# Patient Record
Sex: Male | Born: 2000 | Race: White | Hispanic: No | Marital: Single
Health system: Midwestern US, Academic
[De-identification: ages and names within clinical notes are randomized; demographics above are authoritative.]

---
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
## 2019-01-05 ENCOUNTER — Encounter: Admit: 2019-01-05 | Discharge: 2019-01-05 | Payer: BC Managed Care – PPO

## 2019-01-05 LAB — FREE T4-FREE THYROXINE: Lab: 1 ng/dL (ref 0.6–1.6)

## 2019-01-05 LAB — TSH WITH FREE T4 REFLEX: Lab: 6.7 uU/mL — ABNORMAL HIGH (ref 0.35–5.00)

## 2019-01-05 LAB — THYROGLOBULIN & THYROGLOBULIN AB: Lab: <10 [IU]/mL (ref <116)

## 2019-01-12 ENCOUNTER — Encounter: Admit: 2019-01-12 | Discharge: 2019-01-12 | Payer: BC Managed Care – PPO

## 2019-03-31 ENCOUNTER — Encounter: Admit: 2019-03-31 | Discharge: 2019-03-31 | Payer: BC Managed Care – PPO

## 2019-03-31 NOTE — Telephone Encounter
Patients mother, Delrae Rend, asking for patient to get follow up blood work.

## 2019-03-31 NOTE — Telephone Encounter
He is due for a thyroid test, should have a standing order.  Please also ask him to call us for an appointment around April 2021, tele or in person.  Thank you so much.

## 2019-03-31 NOTE — Telephone Encounter
Mom asked that labs be faxed to Dr Jeneen Rinks Rider's office in [facility].  Fax # is [phone].  She also said she will call to get an appointment for pt.  Faxed Standing Free T4 and TSH using Route order in [place].

## 2019-06-25 ENCOUNTER — Encounter: Admit: 2019-06-25 | Discharge: 2019-06-25 | Payer: BC Managed Care – PPO

## 2019-06-28 ENCOUNTER — Encounter: Admit: 2019-06-28 | Discharge: 2019-06-28 | Payer: BC Managed Care – PPO

## 2019-07-20 NOTE — Telephone Encounter
Informed Stanley Barnes of Dr Charm Rings message.  She said they were back from the doctor.  Patients throat was red and negative for strep.  Gave her the radiology scheduling number.  She said they will get his labs done at that time.

## 2019-07-20 NOTE — Telephone Encounter
Please inform patient   It is good idea to see PCP and we can check his ultrasound since it has been 6 months, and also due for thyroglobulin labs. If can't get these at [facility], can be done locally, I will place an order for them, thanks so much.

## 2019-07-20 NOTE — Telephone Encounter
Mother calling x2 concerning throat pain with patient.  She says she is taking him to his PCP, Dr Nolon Bussing about it but wanted any input from Dr Kirtland Bouchard.Marland Kitchen

## 2019-07-30 MED ORDER — LEVOTHYROXINE 112 MCG PO TAB
224 ug | ORAL_TABLET | Freq: Every day | ORAL | 1 refills | 30.00000 days | Status: DC
Start: 2019-07-30 — End: 2019-07-30

## 2019-07-30 MED ORDER — LEVOTHYROXINE 200 MCG PO TAB
ORAL_TABLET | Freq: Every day | ORAL | 3 refills | 30.00000 days | Status: DC
Start: 2019-07-30 — End: 2019-08-02

## 2019-08-02 ENCOUNTER — Encounter: Admit: 2019-08-02 | Discharge: 2019-08-02 | Payer: BC Managed Care – PPO

## 2019-08-02 DIAGNOSIS — E890 Postprocedural hypothyroidism: Secondary | ICD-10-CM

## 2019-08-02 MED ORDER — LEVOTHYROXINE 200 MCG PO TAB
ORAL_TABLET | Freq: Every day | ORAL | 3 refills | 30.00000 days | Status: DC
Start: 2019-08-02 — End: 2019-09-03

## 2019-09-03 ENCOUNTER — Encounter: Admit: 2019-09-03 | Discharge: 2019-09-03 | Payer: BC Managed Care – PPO

## 2019-09-03 DIAGNOSIS — E890 Postprocedural hypothyroidism: Secondary | ICD-10-CM

## 2019-09-03 MED ORDER — LEVOTHYROXINE 200 MCG PO TAB
ORAL_TABLET | Freq: Every day | ORAL | 3 refills | 30.00000 days | Status: AC
Start: 2019-09-03 — End: ?

## 2019-09-06 IMAGING — CR CHEST
2 series · 2 of 2 positions shown · non-contrast
Comparison: none

[chest pa x-wise]
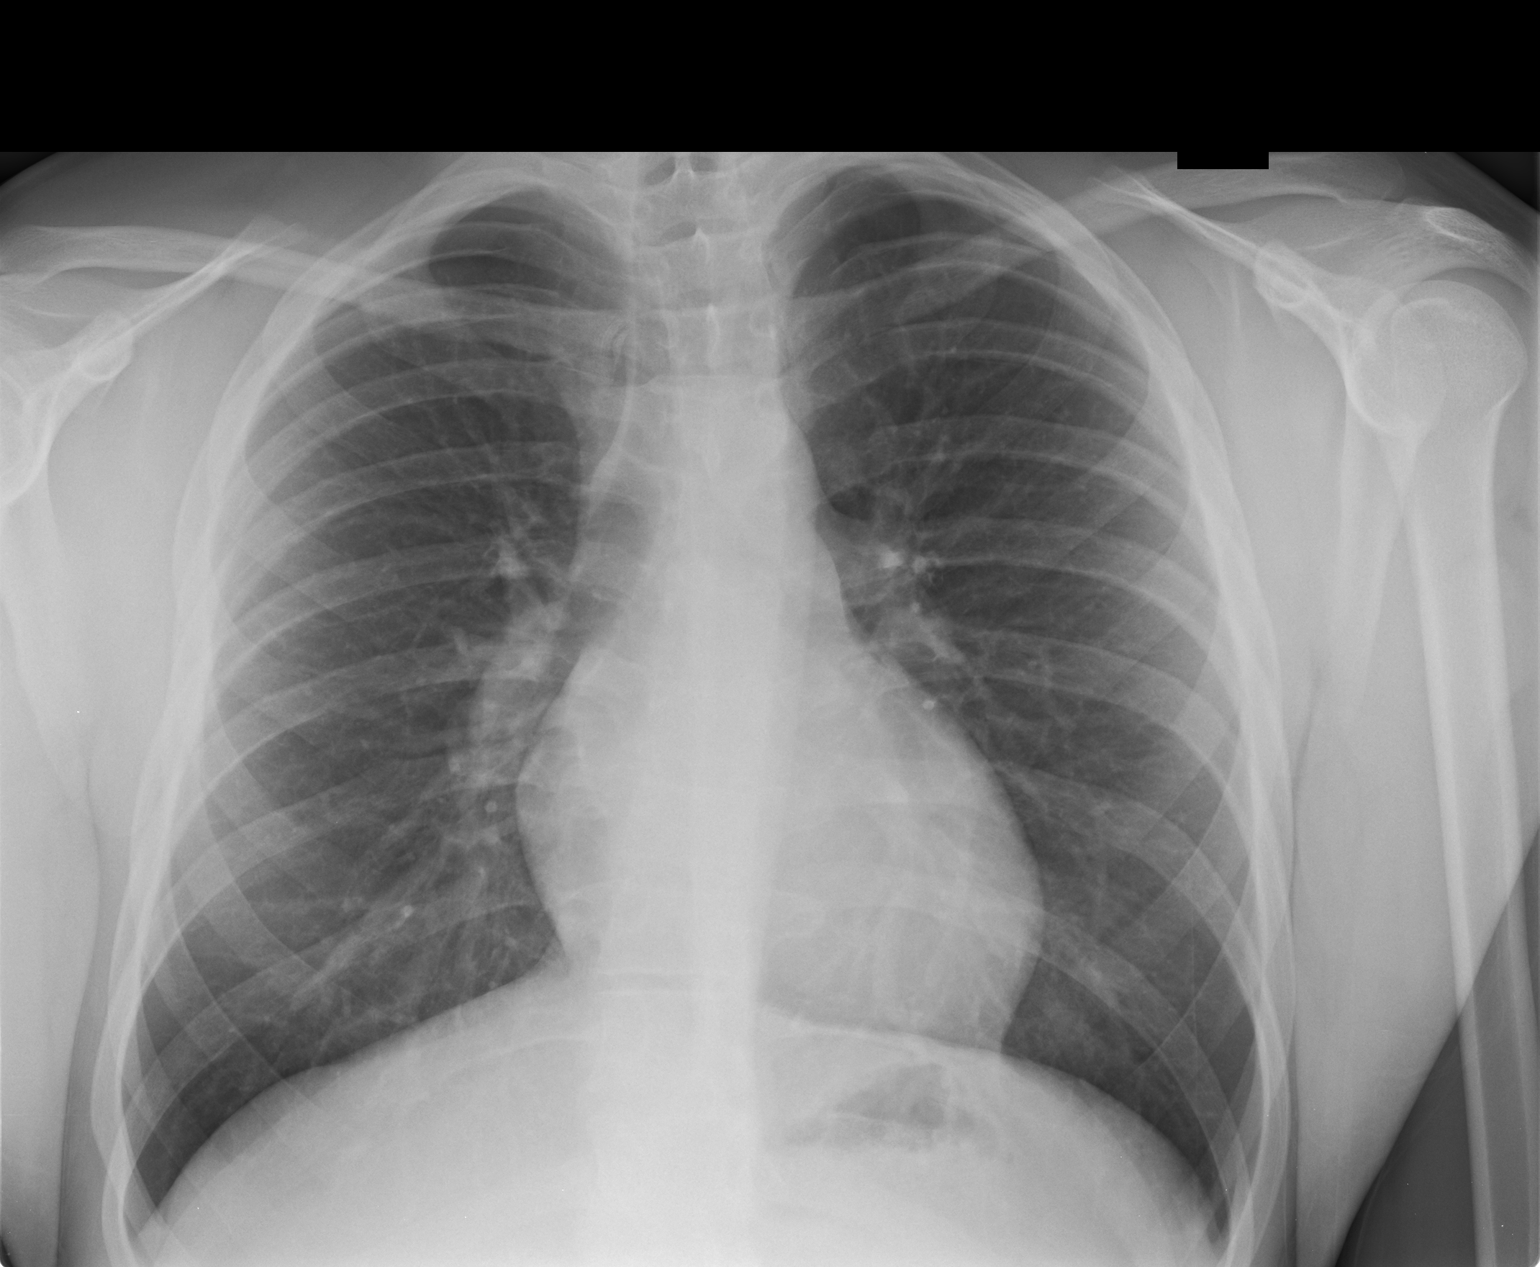

[chest lat]
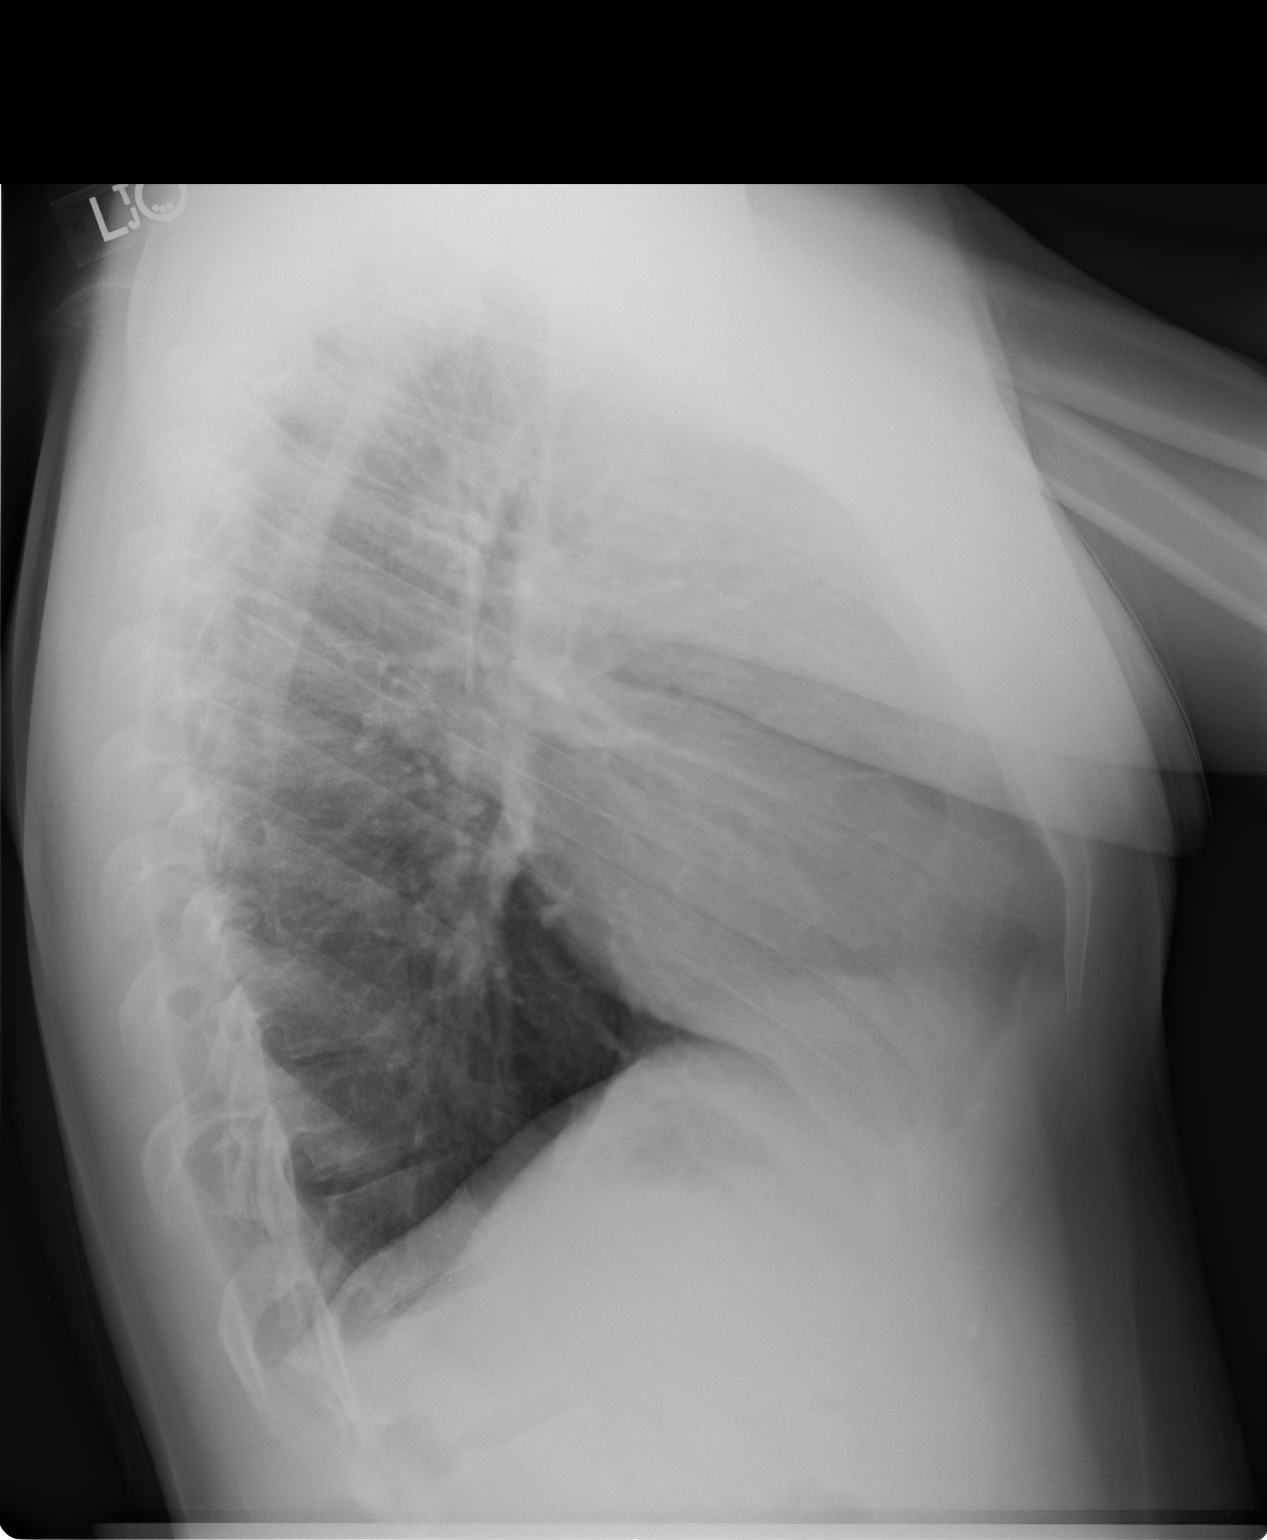

[2 of 2 positions shown; findings below may reference images not displayed]

DIAGNOSTIC STUDIES

EXAM

Chest radiographs.

INDICATION

soa
PT C/O MID CHEST PAIN, SOA. PT SHIELDED. TJ

TECHNIQUE

PA and lateral chest views.

COMPARISONS

None.

FINDINGS

Clear lungs. The cardiomediastinal silhouette is normal. Evidence of old granulomatous infection
with some calcified hilar lymph nodes.

IMPRESSION

No acute cardiopulmonary pathology.

## 2019-10-15 ENCOUNTER — Encounter: Admit: 2019-10-15 | Discharge: 2019-10-15 | Payer: BC Managed Care – PPO

## 2019-11-10 ENCOUNTER — Encounter: Admit: 2019-11-10 | Discharge: 2019-11-10 | Payer: BC Managed Care – PPO

## 2019-11-10 NOTE — Telephone Encounter
Patients mother Serina Cowper called and requested a call back..    Left message for call back with a detailed message.

## 2019-11-29 ENCOUNTER — Encounter: Admit: 2019-11-29 | Discharge: 2019-11-29 | Payer: BC Managed Care – PPO

## 2019-11-29 DIAGNOSIS — E890 Postprocedural hypothyroidism: Secondary | ICD-10-CM

## 2019-11-29 LAB — FREE T4 (FREE THYROXINE) ONLY: Lab: 0.7 ng/dL (ref 0.70–1.48)

## 2019-11-29 LAB — THYROID STIMULATING HORMONE-TSH: Lab: 72 u[IU]/mL — ABNORMAL HIGH (ref 0.350–4.900)

## 2019-12-22 ENCOUNTER — Encounter: Admit: 2019-12-22 | Discharge: 2019-12-22 | Payer: BC Managed Care – PPO

## 2020-02-14 ENCOUNTER — Encounter: Admit: 2020-02-14 | Discharge: 2020-02-14 | Payer: BC Managed Care – PPO

## 2020-02-14 DIAGNOSIS — E890 Postprocedural hypothyroidism: Secondary | ICD-10-CM

## 2020-02-14 DIAGNOSIS — C73 Malignant neoplasm of thyroid gland: Secondary | ICD-10-CM

## 2020-02-14 LAB — FREE T4 (FREE THYROXINE) ONLY: Lab: 1.1 mg/dL (ref >60)

## 2020-02-14 LAB — THYROGLOBULIN & THYROGLOBULIN AB: Lab: 0.9 [IU]/mL (ref 0.0–0.9)

## 2020-02-14 LAB — THYROID STIMULATING HORMONE-TSH: Lab: 5.6 u[IU]/mL — ABNORMAL HIGH (ref 0.350–4.900)

## 2020-03-08 ENCOUNTER — Encounter: Admit: 2020-03-08 | Discharge: 2020-03-08 | Payer: BC Managed Care – PPO

## 2020-03-08 DIAGNOSIS — C73 Malignant neoplasm of thyroid gland: Secondary | ICD-10-CM

## 2020-03-08 LAB — TSH WITH FREE T4 REFLEX
Lab: 0.9 ng/dL (ref 0.70–1.48)
Lab: 41 u[IU]/mL — ABNORMAL HIGH (ref 0.350–4.900)

## 2020-07-14 ENCOUNTER — Encounter: Admit: 2020-07-14 | Discharge: 2020-07-14 | Payer: BC Managed Care – PPO

## 2020-07-14 DIAGNOSIS — C73 Malignant neoplasm of thyroid gland: Secondary | ICD-10-CM

## 2020-07-14 DIAGNOSIS — E890 Postprocedural hypothyroidism: Secondary | ICD-10-CM

## 2020-07-14 LAB — THYROID STIMULATING HORMONE-TSH: Lab: 81 u[IU]/mL — ABNORMAL HIGH (ref 0.350–4.900)

## 2020-07-14 LAB — FREE T4 (FREE THYROXINE) ONLY: Lab: 0.7 ng/dL (ref 0.70–1.48)

## 2020-07-14 NOTE — Telephone Encounter
.  Received request for new lab orders be sent to [place] at [phone]

## 2020-07-14 NOTE — Telephone Encounter
I will fax an order, could you please remind him it is time for an appointment and ultrasound is also due when he come see [place].  So I will go ahead and put an ultrasound order so he can get the ultrasound schedule once he get the date of his follow-up appointment.  Thank you.

## 2020-10-13 ENCOUNTER — Encounter: Admit: 2020-10-13 | Discharge: 2020-10-13 | Payer: BC Managed Care – PPO

## 2020-12-15 ENCOUNTER — Encounter: Admit: 2020-12-15 | Discharge: 2020-12-15 | Payer: BC Managed Care – PPO

## 2020-12-15 NOTE — Telephone Encounter
VM from pt's mother asking to schedule appt    Routing to [facility] staff for assistance in scheduling

## 2020-12-15 NOTE — Telephone Encounter
VM from pt's mother asking to schedule appt    Routing to [facility] staff for assistance in scheduling

## 2021-02-05 ENCOUNTER — Encounter: Admit: 2021-02-05 | Discharge: 2021-02-05 | Payer: BC Managed Care – PPO

## 2021-02-05 DIAGNOSIS — C73 Malignant neoplasm of thyroid gland: Secondary | ICD-10-CM

## 2021-02-05 NOTE — Progress Notes
Please inform patient    labs suggested that you may have not been taking your thyroid medicine regularly, could you please confirm this and he is  also due for an appointment with [place]. Thank you.

## 2021-02-05 NOTE — Progress Notes
Left  Voice message for patient to return call to discuss lab results and thyroid medication compliance

## 2021-02-06 ENCOUNTER — Encounter: Admit: 2021-02-06 | Discharge: 2021-02-06 | Payer: BC Managed Care – PPO

## 2021-02-06 NOTE — Telephone Encounter
Received VM from pt's mother asking for a call back  PC to pt   Pt reacher out about recent labs wanting to know if there was any medication changes  Relayed Dr. Raliegh Barnes message     Stanley Barnes, [name], MD    Endocrinology, Diabetes & Metabolism  Thyroid cancer [facility])    Dx       Progress Notes  Stanley Mcalpine, MD (Physician)   Endocrinology, Diabetes & Metabolism  Please inform patient    labs suggested that you may have not been taking your thyroid medicine regularly, could you please confirm this and he is  also due for an appointment with [place]. Thank you.        Pt verbalized understanding reports that he has been taking his medication regularly and that he has an appointment for [facility]ography[facility] on 02/28/21 at 1600  Encouraged pt to call scheduling to schedule an appointment with Dr. Raliegh Barnes  Provided pt with scheduling number

## 2021-02-26 ENCOUNTER — Encounter: Admit: 2021-02-26 | Discharge: 2021-02-26 | Payer: BC Managed Care – PPO

## 2021-02-27 ENCOUNTER — Encounter: Admit: 2021-02-27 | Discharge: 2021-02-27 | Payer: BC Managed Care – PPO

## 2021-02-28 ENCOUNTER — Ambulatory Visit: Admit: 2021-02-28 | Discharge: 2021-02-28 | Payer: BC Managed Care – PPO

## 2021-02-28 ENCOUNTER — Encounter: Admit: 2021-02-28 | Discharge: 2021-02-28 | Payer: BC Managed Care – PPO

## 2021-02-28 DIAGNOSIS — C73 Malignant neoplasm of thyroid gland: Principal | ICD-10-CM

## 2021-02-28 DIAGNOSIS — M545 Low back pain, unspecified back pain laterality, unspecified chronicity, unspecified whether sciatica present: Secondary | ICD-10-CM

## 2021-02-28 LAB — FREE T4 (FREE THYROXINE) ONLY: FREE T4: 0.6 ng/dL (ref <116)

## 2021-02-28 LAB — THYROGLOBULIN & THYROGLOBULIN AB

## 2021-02-28 LAB — THYROID STIMULATING HORMONE-TSH: TSH: 95 uU/mL — ABNORMAL HIGH (ref 0.35–5.00)

## 2021-03-01 ENCOUNTER — Encounter: Admit: 2021-03-01 | Discharge: 2021-03-01 | Payer: BC Managed Care – PPO

## 2021-03-01 DIAGNOSIS — E890 Postprocedural hypothyroidism: Secondary | ICD-10-CM

## 2021-03-01 MED ORDER — LEVOTHYROXINE 125 MCG PO TAB
250 ug | ORAL_TABLET | Freq: Every day | ORAL | 3 refills | 30.00000 days | Status: AC
Start: 2021-03-01 — End: ?

## 2021-03-15 ENCOUNTER — Encounter: Admit: 2021-03-15 | Discharge: 2021-03-15 | Payer: BC Managed Care – PPO

## 2021-03-15 NOTE — Telephone Encounter
Received VM from Manuela Schwartz from [facility] requesting radiology orders for pt  Placed call to Manuela Schwartz at [facility] requesting a fax number  Printed and fax radiology orders to [place]. Cleone Slim [phone]

## 2021-03-15 NOTE — Progress Notes
Subjective:       History of Present Illness  Stanley Barnes is a 20 y.o. male who presents today as a follow up for papillary carcinoma.  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  He underwent surgical removal on 07/11/2017 at outside facility with pathology showed classic papillary thyroid carcinoma, 4.5 cm. There is no evidence of malignancy in five lymph nodes (0/5).  He underwent completion thyroidectomy on 08/20/2017 with Dr. Cherie Dark.  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  0/8 lymph nodes negative. He received RAI 100 mci on 04/28/18, post treatment WBS showed focal area of increased uptake is seen within the thyroid bed without distant metastasis.  Stimulated thyroglobulin at time of RAI was 12.9. He was last seen on 09/11/18, since last visit,  He recently started to be consistent in levothyroxine regularly on empty stomach in the past 4-6 weeks ago.  Dose was 200 mcg 1 tablet daily Monday through Saturday and 2 tablets on Sunday and recently increased to 125 mcg 2 tablets daily.  His most recent TSH was 95.51 on 02/28/21.  Most recent stimulated thyroglobulin at that time was less than 0.1 on 02/28/2021. He denies changes in his bowel habit. He feels cold all the time.  He noted stiffness in his neck and chronic back pain. He has scoliosis.  He does not take any supplements.  Most recent neck ultrasound on 02/28/2021 showed prior total thyroidectomy without evidence of local recurrent mass or   suspicious cervical lymphadenopathy. He weight 223.4 lbs today, was 240 last visit.       Past medical history GERD, chest pain  Family history mother with migraine, paternal grandmother with ovarian cancer  Social history single, non-smoker, occasional drink alcohol, works at [employer].  Review of Systems  Constitutional:   Fatigue: yes   Pain: yes  Skin:   Heat or cold intolerance : yes  Dry skin : no  Flushing episodes : no  Hair loss : no  Excessive body or facial hair: no  Head   Headaches: no  Double or blurry vision : no  Difficulty swallowing or choking : no  Feeling of food stuck: no  Neck enlargement or lumps : no  Head or chest radiation exposure : no  Decreased hearing : no  Chest/Heart   Nipple discharge : no  Chest pain or pressure: yes  Heartburn: no  Shortness of breath: yes  Heart racing or pounding : no  Digestion   Abdominal or belly pain : no  Constipation : no  Diarrhea : no  Nausea and vomiting: no  Reflux : no  Lactose intollerance : no  Reproduction   Difficulty with erections: no  Decreased interest in sex: no  Urinary   Kidney stones: no   Night time urination : no  Skeleton   Muscle cramping: no  Glands   Weight loss : yes  Weight gain :no  Nerves   Nervous or anxious : no  Seizures or falls : no  Dizziness : no  numbenss or tingling : no  Mental Health   Do you feel sad? : yes  Sleep problems : yes      Objective:         ? acetaminophen (TYLENOL) 325 mg tablet Take two tablets by mouth every 6 hours as needed for Pain.   ?  ibuprofen (ADVIL) 200 mg tablet Take 1-2 tablets by mouth as Needed.   ? levothyroxine (SYNTHROID) 125 mcg tablet Take two tablets by mouth daily 30 minutes before breakfast. Take on an empty stomach, 30-60 min before eating.   ? omeprazole DR(+) (PRILOSEC) 20 mg capsule Take 20 mg by mouth daily before breakfast.     Vitals:    03/16/21 1629   BP: 121/75   BP Source: Arm, Right Upper   Pulse: 84   Temp: 36.8 ?C (98.2 ?F)   Resp: 20   TempSrc: Oral   PainSc: Five   Weight: 101.3 kg (223 lb 6.4 oz)   Height: 185.4 cm (6' 1)     Body mass index is 29.47 kg/m?Marland Kitchen     Physical Exam  General Appearance: moderately overweight, no distress   Skin: warm, no ulcers or xanthomas;  Digits and Nails: no cyanosis or clubbing    Thyroid: no nodules, masses, tenderness or enlargement   Respiratory Effort: breathing comfortably, no respiratory distress   Cardiac Rhythm: regular rhythm and normal rate   Peripheral Circulation: normal peripheral circulation   Lower Extremity Edema: no lower extremity edema   Abdominal Exam: mildly protuberant but soft, non-tender, no masses, no purple striae  Gait & Station: walks without assistance   Muscle Strength: normal muscle tone   Orientation: oriented to time, place and person   Affect & Mood: appropriate and sustained affect   Language and Memory: patient responsive and seems to comprehend information   Neurologic Exam: neurological assessment grossly intact   Other: moves all extremities, No tremor  No tenderness on percussion of the spine.               Latest Reference Range & Units 02/28/21 16:34   Sodium 137 - 147 MMOL/L 138   Potassium 3.5 - 5.1 MMOL/L 3.8   Chloride 98 - 110 MMOL/L 102   CO2 21 - 30 MMOL/L 30   Anion Gap 3 - 12  6   Blood Urea Nitrogen 7 - 25 MG/DL 11   Creatinine 0.4 - 1.61 MG/DL 0.96   eGFR >04 mL/min >60   Glucose 70 - 100 MG/DL 81   Albumin 3.5 - 5.0 G/DL 4.9   Calcium 8.5 - 54.0 MG/DL 9.8   Total Bilirubin 0.3 - 1.2 MG/DL 0.4   Total Protein 6.0 - 8.0 G/DL 7.5   AST (SGOT) 7 - 40 U/L 15   ALT (SGPT) 7 - 56 U/L 21   Alk Phosphatase 25 - 110 U/L 63   T4-Free 0.6 - 1.6 NG/DL 0.6   TSH 9.81 - 1.91 MCU/ML 95.51 (H)   Thyroglobulin Ab <116 IU/mL 16   Thyroglobulin 1.7 - 55.0 NG/ML <0.1 (L)   (H): Data is abnormally high  (L): Data is abnormally low    ULTRASOUND OF THE NECK     CLINICAL INDICATION: Male, 20 years; ?status post total thyroidectomy,   central neck lymph node dissection (08/20/2017), and radioiodine ablation   for papillary thyroid carcinoma.     TECHNIQUE: Multiple grayscale and color Doppler ultrasound images were   obtained of the anterior neck.     COMPARISON: Ultrasound of the head and neck 07/29/2019, 01/05/2019.     FINDINGS:     Central compartment:   Right thyroid bed: Stable architectural distortion in the right thyroid   bed. No new or enlarging nodules.     Left thyroid bed: Stable architectural distortion in the left thyroid bed.  No new or enlarging nodules.     Lymph nodes: No enlarged or morphologically suspicious central compartment   lymph nodes.     Lateral compartments:   Right: Normal size lymph nodes are seen. ?No lymph nodes demonstrate round   shape, calcification or cystic change.     Left: Normal size lymph nodes are seen. ?No lymph nodes demonstrate round   shape, calcification or cystic change.     IMPRESSION       Prior total thyroidectomy without evidence of local recurrent mass or   suspicious cervical lymphadenopathy.     By my electronic signature, I attest that I have personally reviewed the   images for this examination and formulated the interpretations and   opinions expressed in this report       ?Finalized by Lafe Garin, MD on 02/28/2021 5:01 PM. Dictated by Meredith Mody, MD on 02/28/2021 4:27 PM.     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 Cherie Dark, no lymph nodes involvement. He received RAI 100 mci on 04/28/18, post treatment WBS showed focal area of increased uptake is seen within the thyroid bed without distant metastasis.  Stimulated thyroglobulin at time of RAI was 12.9. Most recent neck ultrasound on 02/28/2021 showed no evidence of recurrent.  Most recent stimulated thyroglobulin was not detectable on 02/28/2021- Plan for TSH and thyroglobulin panel yearly.- Plan to follow-up ultrasound in 3 years, sooner if abnormal lab.    Surgical hypothyroidism, with significant elevated TSH.  Levothyroxine was recently increased 250 mcg daily and he just started to take it consistently. Repeat labs in TSH and Ft4 every 4 weeks until stable.     Chronic back pain: check Xray and referral to spine center.        Return to clinic in 6 months.

## 2021-03-16 ENCOUNTER — Encounter: Admit: 2021-03-16 | Discharge: 2021-03-16 | Payer: BC Managed Care – PPO

## 2021-03-16 ENCOUNTER — Ambulatory Visit: Admit: 2021-03-16 | Discharge: 2021-03-16 | Payer: BC Managed Care – PPO

## 2021-03-16 DIAGNOSIS — C73 Malignant neoplasm of thyroid gland: Secondary | ICD-10-CM

## 2021-03-16 DIAGNOSIS — M546 Pain in thoracic spine: Secondary | ICD-10-CM

## 2021-03-16 DIAGNOSIS — M4120 Other idiopathic scoliosis, site unspecified: Secondary | ICD-10-CM

## 2021-03-16 DIAGNOSIS — E890 Postprocedural hypothyroidism: Principal | ICD-10-CM

## 2021-03-16 DIAGNOSIS — G8929 Other chronic pain: Secondary | ICD-10-CM

## 2021-03-16 DIAGNOSIS — R079 Chest pain, unspecified: Secondary | ICD-10-CM

## 2021-03-16 DIAGNOSIS — K219 Gastro-esophageal reflux disease without esophagitis: Secondary | ICD-10-CM

## 2021-03-16 DIAGNOSIS — R0602 Shortness of breath: Secondary | ICD-10-CM

## 2021-03-16 DIAGNOSIS — M549 Dorsalgia, unspecified: Secondary | ICD-10-CM

## 2021-03-28 ENCOUNTER — Encounter: Admit: 2021-03-28 | Discharge: 2021-03-28 | Payer: BC Managed Care – PPO

## 2021-03-29 ENCOUNTER — Encounter: Admit: 2021-03-29 | Discharge: 2021-03-29 | Payer: BC Managed Care – PPO

## 2021-06-28 ENCOUNTER — Encounter: Admit: 2021-06-28 | Discharge: 2021-06-28 | Payer: BC Managed Care – PPO

## 2021-07-03 ENCOUNTER — Encounter: Admit: 2021-07-03 | Discharge: 2021-07-03 | Payer: BC Managed Care – PPO

## 2021-08-03 ENCOUNTER — Encounter: Admit: 2021-08-03 | Discharge: 2021-08-03 | Payer: BC Managed Care – PPO

## 2021-09-25 ENCOUNTER — Encounter: Admit: 2021-09-25 | Discharge: 2021-09-25 | Payer: BC Managed Care – PPO

## 2021-09-26 ENCOUNTER — Encounter: Admit: 2021-09-26 | Discharge: 2021-09-26 | Payer: BC Managed Care – PPO

## 2022-04-16 ENCOUNTER — Encounter: Admit: 2022-04-16 | Discharge: 2022-04-16 | Payer: BC Managed Care – PPO

## 2022-04-29 ENCOUNTER — Encounter: Admit: 2022-04-29 | Discharge: 2022-04-29 | Payer: BC Managed Care – PPO

## 2022-04-30 ENCOUNTER — Encounter: Admit: 2022-04-30 | Discharge: 2022-04-30 | Payer: BC Managed Care – PPO

## 2022-04-30 MED ORDER — LEVOTHYROXINE 200 MCG PO TAB
ORAL_TABLET | ORAL | 0 refills | 30.00000 days | Status: AC
Start: 2022-04-30 — End: ?

## 2022-04-30 NOTE — Telephone Encounter
Recvd fax from [facility] with lab results. Routing to Dr. Maretta Los  Pt last office visit was 03/16/2021; cxl appts on 08/03/2021;  no follow up office visit scheduled. Routing faxed lab results to Dr. Maretta Los  Recommendations from lab review 08/2021:  continue current dose levothyroxine 250 mcg daily on empty stomach and please plan to recheck again in 6 to 8 weeks     Note regarding labs via care everywhere from Dr Megan Salon  note dated  04/29/2022:  Miscellaneous Notes  - documented in this encounter  Telephone Encounter - Howell Pringle, LPN - 11/55/2080 2:23 PM CST  Formatting of this note might be different from the original.  Spoke with this patient and his mother about Tsh and free T4 results asked patient if he was taking his Thyroid medication patient said yes mom stated that he was taking Synthroid '200mg'$  Monday through Saturday and on Sunday he was taking '400mg'$ . I also asked mom when this patient last seen his Thyroid cancer doctor his mom stated over 2 years ago. I suggested to mom that she called and made an appointment to get this patient in as soon as possible she stated that she was going to call and make an appointment today. Lab faxed down to Dr. Kandis Mannan office .Dr. Megan Salon did look at lab results did not give any orders.  Electronically signed by Howell Pringle, LPN at 36/03/2448 7:53 PM CST

## 2022-04-30 NOTE — Telephone Encounter
Spoke to pt mom, Delrae Rend who confirmed pt was taking medication daily with no interference from foods, drink or other medications. Pt only takes naproxen; no other meds.  Advised of dose changes; pt mom agreeable to dose change and appt with Dr. Maretta Los on 05/16/2022 1000 Telehealth.     Med list updated; script sent

## 2022-04-30 NOTE — Telephone Encounter
The lab suggested that he has not been taking thyroid regularly.  At this time, he will need significantly higher dose given significantly elevated TSH which I would like him to take 400 mcg Monday through Friday and 200 mcg Saturday and Sunday, increased from 200 mcg 6 days a week and 400 on Sunday.  I would like to see him either in person or telehealth on 2/15 at 10 AM if he could make it.  Thank you.

## 2022-05-14 ENCOUNTER — Encounter: Admit: 2022-05-14 | Discharge: 2022-05-14 | Payer: BC Managed Care – PPO

## 2022-05-14 NOTE — Progress Notes
Subjective:       Obtained patient's verbal consent to treat them and their agreement to [facility] financial policy and NPP via this telehealth visit during the [facility]          Stanley Barnes is a 22 y.o. male who presents today as a follow up for papillary carcinoma.  He reported history of intermittent chest pain since age 22, CT chest demonstrated a well-defined mass extending from thyroid to the sternal notch suggestive of a teratoma.  He underwent surgical removal on 07/11/2017 at outside facility with pathology showed classic papillary thyroid carcinoma, 4.5 cm. There is no evidence of malignancy in five lymph nodes (0/5).  He underwent completion thyroidectomy on 08/20/2017 with Dr. Cherie Dark.  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  0/8 lymph nodes negative. He received RAI 100 mci on 04/28/18, post treatment WBS showed focal area of increased uptake is seen within the thyroid bed without distant metastasis.  Stimulated thyroglobulin at time of RAI was 12.9. He was last seen on 03/16/21, since last visit,  his most recent labs showed significant elevated TSH of 149 on 04/25/2022, it was 3.33 on 09/24/21.  He has been feeling tired, sick, and hurting all over. He reports more body odor. He denies changes in his bowel habit. He feels cold all the time.  He denies any significant weight changes.  He reports being consistent in levothyroxine regularly and try to take it on empty stomach most of the time. He does not take any supplements. Current levothyroxine dose was recently increased to 400 mcg Monday through Friday and 200 mcg Saturday and Sunday, from 200 mcg 6 days a week and 400 on Sunday.    Most recent stimulated thyroglobulin was less than 0.1 on 02/28/2021 (TSH of 95.51).  Most recent neck ultrasound on 02/28/2021 showed prior total thyroidectomy without evidence of local recurrent mass or suspicious cervical lymphadenopathy. Past medical history GERD, chest pain  Family history mother with migraine, paternal grandmother with ovarian cancer  Social history single, non-smoker, occasional drink alcohol, works at [employer].  Review of Systems  Constitutional:   Fatigue: yes   Pain: yes  Skin:   Heat or cold intolerance : yes  Dry skin : no  Flushing episodes : no  Hair loss : no  Excessive body or facial hair: no  Head   Headaches: no  Double or blurry vision : no  Difficulty swallowing or choking : no  Feeling of food stuck: no  Neck enlargement or lumps : no  Head or chest radiation exposure : no  Decreased hearing : no  Chest/Heart   Nipple discharge : no  Chest pain or pressure: yes  Heartburn: no  Shortness of breath: yes  Heart racing or pounding : no  Digestion   Abdominal or belly pain : no  Constipation : no  Diarrhea : no  Nausea and vomiting: no  Reflux : no  Lactose intollerance : no  Reproduction   Difficulty with erections: no  Decreased interest in sex: no  Urinary   Kidney stones: no   Night time urination : no  Skeleton   Muscle cramping: no  Glands   Weight loss : no  Weight gain :no  Nerves   Nervous or anxious : no  Seizures or falls : no  Dizziness : yes  numbness or tingling : yes  Mental Health   Do you feel sad? : stable  Sleep problems : yes      Objective:          acetaminophen (TYLENOL) 325 mg tablet Take two tablets by mouth every 6 hours as needed for Pain.    ibuprofen (ADVIL) 200 mg tablet Take 1-2 tablets by mouth as Needed.    levothyroxine (SYNTHROID) 200 mcg tablet take 400 mcg (2 tabs) Monday through Friday and 200 mcg (1 tab) Saturday and Sunday  Indications: a condition with low thyroid hormone levels    omeprazole DR(+) (PRILOSEC) 20 mg capsule Take 20 mg by mouth daily before breakfast.     There were no vitals filed for this visit.    There is no height or weight on file to calculate BMI.     Physical Exam  General Appearance: alert, no distress   Respiratory Effort: breathing comfortably, no respiratory distress   Orientation: oriented to time, place and person   Affect & Mood: appropriate and sustained affect   Language and Memory: patient responsive and seems to comprehend information                  Latest Reference Range & Units 02/28/21 16:34   Sodium 137 - 147 MMOL/L 138   Potassium 3.5 - 5.1 MMOL/L 3.8   Chloride 98 - 110 MMOL/L 102   CO2 21 - 30 MMOL/L 30   Anion Gap 3 - 12  6   Blood Urea Nitrogen 7 - 25 MG/DL 11   Creatinine 0.4 - 1.24 MG/DL 1.24   eGFR >60 mL/min >60   Glucose 70 - 100 MG/DL 81   Albumin 3.5 - 5.0 G/DL 4.9   Calcium 8.5 - 10.6 MG/DL 9.8   Total Bilirubin 0.3 - 1.2 MG/DL 0.4   Total Protein 6.0 - 8.0 G/DL 7.5   AST (SGOT) 7 - 40 U/L 15   ALT (SGPT) 7 - 56 U/L 21   Alk Phosphatase 25 - 110 U/L 63   T4-Free 0.6 - 1.6 NG/DL 0.6   TSH 0.35 - 5.00 MCU/ML 95.51 (H)   Thyroglobulin Ab <116 IU/mL 16   Thyroglobulin 1.7 - 55.0 NG/ML <0.1 (L)   (H): Data is abnormally high  (L): Data is abnormally low  Component 04/25/22 09/24/21 06/27/21 02/02/21 11/16/20 07/11/20   TSH 149.000 High  3.330 25.000 High  130.000 High  10.400 High  81.700       01 /25/24 06/27/21 02/02/21 11/16/20 07/11/20 03/02/20    Free T4 0.52 Low  1.03 0.51 Low  1.38 0.70 0.94     ULTRASOUND OF THE NECK     CLINICAL INDICATION: Male, 20 years;  status post total thyroidectomy,   central neck lymph node dissection (08/20/2017), and radioiodine ablation   for papillary thyroid carcinoma.     TECHNIQUE: Multiple grayscale and color Doppler ultrasound images were   obtained of the anterior neck.     COMPARISON: Ultrasound of the head and neck 07/29/2019, 01/05/2019.     FINDINGS:     Central compartment:   Right thyroid bed: Stable architectural distortion in the right thyroid   bed. No new or enlarging nodules.     Left thyroid bed: Stable architectural distortion in the left thyroid bed.   No new or enlarging nodules.     Lymph nodes: No enlarged or morphologically suspicious central compartment   lymph nodes.     Lateral compartments:   Right: Normal size lymph nodes are seen.  No lymph nodes demonstrate round   shape,  calcification or cystic change.     Left: Normal size lymph nodes are seen.  No lymph nodes demonstrate round   shape, calcification or cystic change.     IMPRESSION       Prior total thyroidectomy without evidence of local recurrent mass or   suspicious cervical lymphadenopathy.     By my electronic signature, I attest that I have personally reviewed the   images for this examination and formulated the interpretations and   opinions expressed in this report        Finalized by Lafe Garin, MD on 02/28/2021 5:01 PM. Dictated by Meredith Mody, MD on 02/28/2021 4:27 PM.     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 Cherie Dark, no lymph nodes involvement. He received RAI 100 mci on 04/28/18, post treatment WBS showed focal area of increased uptake is seen within the thyroid bed without distant metastasis.  Stimulated thyroglobulin at time of RAI was 12.9. Most recent neck ultrasound on 02/28/2021 showed no evidence of recurrent.  Most recent stimulated thyroglobulin was not detectable on 02/28/2021- Plan for TSH and thyroglobulin panel  this year and yearly.- Plan to follow-up ultrasound in 3-5 years, sooner if abnormal lab.    Surgical hypothyroidism, with significant elevated TSH.  Levothyroxine was recently increased  increased to 400 mcg Monday through Friday and 200 mcg Saturday and Sunday, from 200 mcg 6 days a week and 400 on Sunday.  He denies symptoms of celiac disease.  We discussed importance of taking levothyroxine separately on empty stomach with water and wait at least 30 minutes prior to eating.  Plan to monitor labs in TSH and Ft4 every 4 weeks until stable.       Return to clinic in 6 months.

## 2022-05-16 ENCOUNTER — Encounter: Admit: 2022-05-16 | Discharge: 2022-05-16 | Payer: BC Managed Care – PPO

## 2022-05-16 ENCOUNTER — Ambulatory Visit: Admit: 2022-05-16 | Discharge: 2022-05-17 | Payer: BC Managed Care – PPO

## 2022-05-16 DIAGNOSIS — M549 Dorsalgia, unspecified: Secondary | ICD-10-CM

## 2022-05-16 DIAGNOSIS — C73 Malignant neoplasm of thyroid gland: Secondary | ICD-10-CM

## 2022-05-16 DIAGNOSIS — R079 Chest pain, unspecified: Secondary | ICD-10-CM

## 2022-05-16 DIAGNOSIS — R0602 Shortness of breath: Secondary | ICD-10-CM

## 2022-05-16 DIAGNOSIS — K219 Gastro-esophageal reflux disease without esophagitis: Secondary | ICD-10-CM

## 2022-05-16 DIAGNOSIS — E890 Postprocedural hypothyroidism: Secondary | ICD-10-CM

## 2022-05-16 NOTE — Patient Instructions
Plan to follow-up TSH and free T4 every 4 weeks for now until we get your level improved.

## 2022-05-17 ENCOUNTER — Encounter: Admit: 2022-05-17 | Discharge: 2022-05-17 | Payer: BC Managed Care – PPO

## 2022-05-17 NOTE — Telephone Encounter
Lab orders faxed to PCP, Dr Charm Rings office per request form Dr. Maretta Los

## 2022-06-24 ENCOUNTER — Encounter: Admit: 2022-06-24 | Discharge: 2022-06-24 | Payer: BC Managed Care – PPO

## 2022-06-24 DIAGNOSIS — C73 Malignant neoplasm of thyroid gland: Secondary | ICD-10-CM

## 2022-06-25 ENCOUNTER — Encounter: Admit: 2022-06-25 | Discharge: 2022-06-25 | Payer: BC Managed Care – PPO

## 2022-06-25 DIAGNOSIS — C73 Malignant neoplasm of thyroid gland: Secondary | ICD-10-CM

## 2022-08-14 ENCOUNTER — Encounter: Admit: 2022-08-14 | Discharge: 2022-08-14 | Payer: BC Managed Care – PPO

## 2022-08-14 NOTE — Telephone Encounter
Pt has been sick for 3 weeks; reports throwing up, diarrhea, no energy, taking as synthroid as prescribed: Synthroid taking 2 mon-fri and one tab sat-sun   Pt had thyroid labs done and will have results sent to this office. Advised will have Dr. Lajoyce Corners review lab results and call pt mom back with recommendations.

## 2022-08-19 ENCOUNTER — Encounter: Admit: 2022-08-19 | Discharge: 2022-08-19 | Payer: BC Managed Care – PPO

## 2022-08-19 DIAGNOSIS — C73 Malignant neoplasm of thyroid gland: Secondary | ICD-10-CM

## 2022-08-23 ENCOUNTER — Encounter: Admit: 2022-08-23 | Discharge: 2022-08-23 | Payer: BC Managed Care – PPO

## 2022-09-25 ENCOUNTER — Encounter: Admit: 2022-09-25 | Discharge: 2022-09-25 | Payer: BC Managed Care – PPO

## 2022-09-25 DIAGNOSIS — C73 Malignant neoplasm of thyroid gland: Secondary | ICD-10-CM

## 2022-11-11 LAB — THYROID STIMULATING HORMONE-TSH: TSH: 91 u[IU]/mL — AB (ref 0.350–4.90)

## 2022-11-11 LAB — FREE T4 (FREE THYROXINE) ONLY: FREE T4: 0.8 ng/dL (ref 0.70–1.48)

## 2022-11-12 ENCOUNTER — Encounter: Admit: 2022-11-12 | Discharge: 2022-11-12 | Payer: BC Managed Care – PPO

## 2022-11-12 DIAGNOSIS — C73 Malignant neoplasm of thyroid gland: Secondary | ICD-10-CM

## 2022-11-18 NOTE — Telephone Encounter
Lab orders faxed via [employer]

## 2023-01-20 ENCOUNTER — Encounter: Admit: 2023-01-20 | Discharge: 2023-01-20 | Payer: BC Managed Care – PPO

## 2023-01-20 DIAGNOSIS — C73 Malignant neoplasm of thyroid gland: Secondary | ICD-10-CM

## 2023-05-28 ENCOUNTER — Encounter: Admit: 2023-05-28 | Discharge: 2023-05-28 | Payer: BC Managed Care – PPO

## 2023-05-29 ENCOUNTER — Encounter: Admit: 2023-05-29 | Discharge: 2023-05-29 | Payer: BC Managed Care – PPO

## 2023-06-19 ENCOUNTER — Encounter: Admit: 2023-06-19 | Discharge: 2023-06-19 | Payer: BC Managed Care – PPO
